# Patient Record
Sex: Female | Born: 2000 | Race: Black or African American | Hispanic: No | Marital: Single | Smoking: Never smoker
Health system: Southern US, Community
[De-identification: ages and names within clinical notes are randomized; demographics above are authoritative.]

## PROBLEM LIST (undated history)

## (undated) DIAGNOSIS — Z789 Other specified health status: Secondary | ICD-10-CM

## (undated) DIAGNOSIS — F32A Depression, unspecified: Secondary | ICD-10-CM

## (undated) HISTORY — DX: Depression, unspecified: F32.A

## (undated) HISTORY — DX: Other specified health status: Z78.9

## (undated) HISTORY — PX: NO PAST SURGERIES: SHX2092

---
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
## 2021-08-15 ENCOUNTER — Ambulatory Visit (LOCAL_COMMUNITY_HEALTH_CENTER): Payer: Medicaid Other

## 2021-08-15 VITALS — BP 122/75 | Ht 64.0 in | Wt 153.5 lb

## 2021-08-15 DIAGNOSIS — Z3201 Encounter for pregnancy test, result positive: Secondary | ICD-10-CM | POA: Diagnosis not present

## 2021-08-15 LAB — PREGNANCY, URINE: Preg Test, Ur: POSITIVE — AB

## 2021-08-15 MED ORDER — PRENATAL 27-0.8 MG PO TABS: 1.0000 | ORAL_TABLET | Freq: Every day | ORAL | 0 refills | Status: AC

## 2021-08-15 NOTE — Progress Notes (Signed)
UPT positive. Plans prenatal care at [facility]. Reports n/v daily. Advised to seek immediate med attn/go to ER if unable to eat/drink without vomiting. Discussed measures to help with n/v such as freq small meals, ginger ale, "bland" foods. Sent to clerk for preadmit. Josie Saunders, RN ? ?

## 2021-09-20 ENCOUNTER — Encounter: Payer: Self-pay | Admitting: Advanced Practice Midwife

## 2021-09-20 ENCOUNTER — Ambulatory Visit: Payer: Medicaid Other | Admitting: Advanced Practice Midwife

## 2021-09-20 DIAGNOSIS — F332 Major depressive disorder, recurrent severe without psychotic features: Secondary | ICD-10-CM

## 2021-09-20 DIAGNOSIS — Z7289 Other problems related to lifestyle: Secondary | ICD-10-CM | POA: Insufficient documentation

## 2021-09-20 DIAGNOSIS — Z3481 Encounter for supervision of other normal pregnancy, first trimester: Secondary | ICD-10-CM | POA: Diagnosis not present

## 2021-09-20 DIAGNOSIS — F17290 Nicotine dependence, other tobacco product, uncomplicated: Secondary | ICD-10-CM

## 2021-09-20 DIAGNOSIS — D573 Sickle-cell trait: Secondary | ICD-10-CM | POA: Insufficient documentation

## 2021-09-20 DIAGNOSIS — Z3480 Encounter for supervision of other normal pregnancy, unspecified trimester: Secondary | ICD-10-CM | POA: Insufficient documentation

## 2021-09-20 LAB — URINALYSIS
Bilirubin, UA: NEGATIVE
Glucose, UA: NEGATIVE
Ketones, UA: NEGATIVE
Leukocytes,UA: NEGATIVE
Nitrite, UA: NEGATIVE
Protein,UA: NEGATIVE
RBC, UA: NEGATIVE
Specific Gravity, UA: 1.02 (ref 1.005–1.030)
Urobilinogen, Ur: 0.2 mg/dL (ref 0.2–1.0)
pH, UA: 6.5 (ref 5.0–7.5)

## 2021-09-20 LAB — WET PREP FOR TRICH, YEAST, CLUE
Trichomonas Exam: NEGATIVE
Yeast Exam: NEGATIVE

## 2021-09-20 LAB — HEMOGLOBIN, FINGERSTICK: Hemoglobin: 13.4 g/dL (ref 11.1–15.9)

## 2021-09-20 NOTE — Progress Notes (Signed)
[facility] Health Department  Maternal Health Clinic   INITIAL PRENATAL VISIT NOTE  Subjective:  Michele Barnes is a 21 y.o. SBF exsmoker G2P0010 at [redacted]w[redacted]d being seen today to start prenatal care at the [facility] Department. She feels "I like it" about surprise pregnancy with no birth control. 21 yo employed FOB feels "he likes it" about pregnancy; in supportive 6 year relationship; he has no kids. She will begin a new job soon and is living alone. LMP 07/01/21. Denies u/s, or ER use this pregnancy. Denies cigs, vaping. Last cigar 06/2021. Last MJ 08/13/21, last ETOH 07/25/21 (3 shots Tequila). 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 PHQ-9=19. Cries 1x/wk, +anxiety, sleep up and down, appetite up and down, +moody, irritable, +anhedonia, low energy level, -SI/HI.  Last dental exam 2019.  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  Patient reports no complaints.  Contractions: Not present. Vag. Bleeding: None.  Movement: Absent. Denies leaking of fluid.   Indications for ASA therapy (per uptodate) One of the following: Previous pregnancy with preeclampsia, especially early onset and with an adverse outcome No Multifetal gestation No Chronic hypertension No Type 1 or 2 diabetes mellitus No Chronic kidney disease No Autoimmune disease (antiphospholipid syndrome, systemic lupus erythematosus) No  Two or more of the following: Nulliparity No Obesity (body mass index >30 kg/m2) No Family history of preeclampsia in mother or sister No Age ?35 years No Sociodemographic characteristics (African American race, low socioeconomic level) Yes Personal risk factors (eg, previous pregnancy with low birth  weight or small for gestational age infant, previous adverse pregnancy outcome [eg, stillbirth], interval >10 years between pregnancies) No   The following portions of the patient's history were reviewed and updated as appropriate: allergies, current medications, past family history, past medical history, past social history, past surgical history and problem list. Problem list updated.  Objective:   Vitals:   09/20/21 1316  BP: 124/78  Pulse: (!) 104  Temp: (!) 97.4 F (36.3 C)  Weight: 159 lb 12.8 oz (72.5 kg)    Fetal Status: Fetal Heart Rate (bpm): 160 Fundal Height: 10 cm Movement: Absent  Presentation: Undeterminable   Physical Exam Vitals and nursing note reviewed.  Constitutional:      General: She is not in acute distress.    Appearance: Normal appearance. She is well-developed and normal weight.  HENT:     Head: Normocephalic and atraumatic.     Right Ear: External ear normal.     Left Ear: External ear normal.     Nose: Nose normal. No congestion or rhinorrhea.     Mouth/Throat:     Lips: Pink.     Mouth: Mucous membranes are moist.     Dentition: Normal dentition. No dental caries.     Pharynx: Oropharynx is clear. Uvula midline.     Comments: Dentition: fair; last dental exam 2019 Eyes:     General: No scleral icterus.    Conjunctiva/sclera: Conjunctivae normal.  Neck:     Thyroid: No thyroid mass, thyromegaly or thyroid tenderness.  Cardiovascular:     Rate and Rhythm: Normal rate.     Pulses: Normal pulses.     Comments: Extremities are warm and well perfused Pulmonary:     Effort: Pulmonary effort is normal.  Breath sounds: Normal breath sounds.  Chest:     Chest wall: No mass.  Breasts:    Tanner Score is 5.     Breasts are symmetrical.     Right: Normal. No mass, nipple discharge or skin change.     Left: Normal. No mass, nipple discharge or skin change.  Abdominal:     General: Abdomen is flat.     Palpations: Abdomen is soft.      Tenderness: There is no abdominal tenderness.     Comments: Gravid, soft without masses or tenderness, good tone, FHR=160, FH 10-11 wks size  Genitourinary:    General: Normal vulva.     Exam position: Lithotomy position.     Pubic Area: No rash.      Labia:        Right: No rash.        Left: No rash.      Vagina: Vaginal discharge (grey leukorrhea, ph<4.5) present.     Cervix: Normal.     Uterus: Normal. Enlarged (Gravid 10-11 wks size). Not tender.      Adnexa: Right adnexa normal and left adnexa normal.     Rectum: Normal. No external hemorrhoid.  Musculoskeletal:     Right lower leg: No edema.     Left lower leg: No edema.  Lymphadenopathy:     Cervical: No cervical adenopathy.     Upper Body:     Right upper body: No axillary adenopathy.     Left upper body: No axillary adenopathy.  Skin:    General: Skin is warm.     Capillary Refill: Capillary refill takes less than 2 seconds.  Neurological:     Mental Status: She is alert.    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 Prenatal care, subsequent pregnancy, first trimester U/s ordered for dating Pt desires NIPS at time of u/s Counseled on weight gain of 15-25 lbs Please give pt dental list - Prenatal profile without Varicella/Rubella ([id number]) - HCV Ab w Reflex to Quant PCR - HIV-1/HIV-2 Qualitative RNA - Hemoglobinopathy evaluation -[id number] - Urine Culture - Chlamydia/GC NAA, Confirmation - Hgb A1c w/o eAG - [id number] Drug Screen - Urinalysis (Urine Dip) - WET PREP FOR TRICH, YEAST, CLUE - Hemoglobin, venipuncture  3. Severe episode of recurrent major depressive disorder, without psychotic features (HCC) Agrees to counseling with Kathreen Cosier, LCSW--please give pt contact info  4. Cigar smoker States last use 06/2021--counseled not to smoke    Discussed overview  of care and coordination with inpatient delivery practices including [facility], [facility], [facility] and [facility].   Reviewed Centering pregnancy as standard of care at [facility]   Preterm labor symptoms and general obstetric precautions including but not limited to vaginal bleeding, contractions, leaking of fluid and fetal movement were reviewed in detail with the patient.  Please refer to After Visit Summary for other counseling recommendations.   Return in about 4 weeks (around 10/18/2021) for routine PNC.  No future appointments.  Alberteen Spindle, CNM

## 2021-09-20 NOTE — Progress Notes (Signed)
Here today for 11.4 week MH IP. Taking PNV QD. Denies ED/hospital visits since +PT. Jacoya Bauman, RN ?

## 2021-09-20 NOTE — Progress Notes (Signed)
Hgb, UA and wet mount results reviewed while in clinic. Hal Morales, RN

## 2021-09-21 LAB — 789231 7+OXYCODONE-BUND
Amphetamines, Urine: NEGATIVE ng/mL
BENZODIAZ UR QL: NEGATIVE ng/mL
Barbiturate screen, urine: NEGATIVE ng/mL
Cannabinoid Quant, Ur: NEGATIVE ng/mL
Cocaine (Metab.): NEGATIVE ng/mL
OPIATE SCREEN URINE: NEGATIVE ng/mL
Oxycodone/Oxymorphone, Urine: NEGATIVE ng/mL
PCP Quant, Ur: NEGATIVE ng/mL

## 2021-09-22 LAB — URINE CULTURE: Organism ID, Bacteria: NO GROWTH

## 2021-09-23 LAB — HGB FRACTIONATION CASCADE
Hgb A2: 3.4 % — ABNORMAL HIGH (ref 1.8–3.2)
Hgb A: 55 % — ABNORMAL LOW (ref 96.4–98.8)
Hgb F: 0 % (ref 0.0–2.0)
Hgb S: 41.6 % — ABNORMAL HIGH

## 2021-09-23 LAB — CBC/D/PLT+RPR+RH+ABO+AB SCR
Antibody Screen: NEGATIVE
Basophils Absolute: 0.1 10*3/uL (ref 0.0–0.2)
Basos: 0 %
EOS (ABSOLUTE): 0.2 10*3/uL (ref 0.0–0.4)
Eos: 2 %
Hematocrit: 40.4 % (ref 34.0–46.6)
Hemoglobin: 13.8 g/dL (ref 11.1–15.9)
Hepatitis B Surface Ag: NEGATIVE
Immature Grans (Abs): 0 10*3/uL (ref 0.0–0.1)
Immature Granulocytes: 0 %
Lymphocytes Absolute: 1.5 10*3/uL (ref 0.7–3.1)
Lymphs: 13 %
MCH: 29.9 pg (ref 26.6–33.0)
MCHC: 34.2 g/dL (ref 31.5–35.7)
MCV: 88 fL (ref 79–97)
Monocytes Absolute: 0.6 10*3/uL (ref 0.1–0.9)
Monocytes: 5 %
Neutrophils Absolute: 8.8 10*3/uL — ABNORMAL HIGH (ref 1.4–7.0)
Neutrophils: 80 %
Platelets: 200 10*3/uL (ref 150–450)
RBC: 4.61 x10E6/uL (ref 3.77–5.28)
RDW: 12.6 % (ref 11.7–15.4)
RPR Ser Ql: NONREACTIVE
Rh Factor: POSITIVE
WBC: 11.1 10*3/uL — ABNORMAL HIGH (ref 3.4–10.8)

## 2021-09-23 LAB — HGB SOLUBILITY: Hgb Solubility: POSITIVE — AB

## 2021-09-23 LAB — HIV-1/HIV-2 QUALITATIVE RNA
HIV-1 RNA, Qualitative: NONREACTIVE
HIV-2 RNA, Qualitative: NONREACTIVE

## 2021-09-23 LAB — CHLAMYDIA/GC NAA, CONFIRMATION
Chlamydia trachomatis, NAA: NEGATIVE
Neisseria gonorrhoeae, NAA: NEGATIVE

## 2021-09-23 LAB — HGB A1C W/O EAG: Hgb A1c MFr Bld: 5 % (ref 4.8–5.6)

## 2021-09-23 LAB — HCV INTERPRETATION

## 2021-09-23 LAB — HCV AB W REFLEX TO QUANT PCR: HCV Ab: NONREACTIVE

## 2021-09-28 ENCOUNTER — Telehealth: Payer: Self-pay

## 2021-09-28 ENCOUNTER — Telehealth: Payer: Self-pay | Admitting: Family Medicine

## 2021-09-28 NOTE — Telephone Encounter (Signed)
Phone call to patient to provide phone number to call and schedule [facility] [place] appt due to ATC x3 by [facility] to schedule above but has not been able to reach patient. RN spoke with female identifying herself as "this is her mom." Same advised she will have patient return our call. Tawny Hopping, RN

## 2021-09-28 NOTE — Telephone Encounter (Signed)
See other phone note..Rozanna Cormany Brewer-Jensen, RN  °

## 2021-09-28 NOTE — Telephone Encounter (Signed)
Patient called back and call returned. Patient counseled that [facility] called her 3 times to schedule her U/S and they were unable to reach her. Patient given [facility] U/S scheduling number and counseled to call to set up her U/S. Patient read number back to me and states she will call [facility] to schedule her appointment. Marland KitchenBurt Knack, RN

## 2021-10-06 NOTE — Addendum Note (Signed)
Addended by: Heywood Bene on: 10/06/2021 11:25 AM   Modules accepted: Orders

## 2021-10-12 ENCOUNTER — Telehealth: Payer: Self-pay | Admitting: Family Medicine

## 2021-10-12 NOTE — Telephone Encounter (Signed)
Pt calls & requests her lab results from her New OB visit on 09/20/21.

## 2021-10-12 NOTE — Telephone Encounter (Signed)
Client requesting results of NIPS testing. Counseled this testing was not done at initial OB appt, but NIPS ordered with [place] appt (per provider note). Per pink sticky note, [facility] was unable to contact client via phone to schedule appt and [facility] gave client number to call to schedule. Per client, she called number and was told a new referral needed. Client given [facility] OB US scheduler number again and encouraged to call as referral for [place] stays open for several months. Client to call clinic back if unable to schedule appt. Rich Number, RN

## 2021-10-18 ENCOUNTER — Ambulatory Visit: Payer: Medicaid Other | Admitting: Advanced Practice Midwife

## 2021-10-18 VITALS — BP 105/69 | HR 76 | Temp 97.8°F | Wt 167.0 lb

## 2021-10-18 DIAGNOSIS — F17290 Nicotine dependence, other tobacco product, uncomplicated: Secondary | ICD-10-CM | POA: Diagnosis not present

## 2021-10-18 DIAGNOSIS — F332 Major depressive disorder, recurrent severe without psychotic features: Secondary | ICD-10-CM

## 2021-10-18 DIAGNOSIS — Z3481 Encounter for supervision of other normal pregnancy, first trimester: Secondary | ICD-10-CM

## 2021-10-18 DIAGNOSIS — Z3482 Encounter for supervision of other normal pregnancy, second trimester: Secondary | ICD-10-CM

## 2021-10-18 DIAGNOSIS — D573 Sickle-cell trait: Secondary | ICD-10-CM

## 2021-10-18 NOTE — Progress Notes (Addendum)
Patient here for MH RV at 15 4/7. Desires Materni 21 and AFP today. Patient states she tried to call [facility] to schedule her U/S and she was told they needed a new form. Patient unsure what they meant. TC to [facility] U/S scheduling at 4:20 and they are not open. Will try again tomorrow to find out what they need before scheduling patient's U/S. Patient phone number verified.Marland KitchenMarland KitchenBurt Knack, RN

## 2021-10-18 NOTE — Progress Notes (Signed)
Urine dip reviewed, no new orders. FOB made appointment to be tested for sickle cell trait.Burt Knack, RN

## 2021-10-18 NOTE — Progress Notes (Signed)
[facility]  PRENATAL VISIT NOTE  Subjective:  Michele Barnes is a 21 y.o. G2P0010 at [redacted]w[redacted]d being seen today for ongoing prenatal care.  She is currently monitored for the following issues for this low-risk pregnancy and has MDD (major depressive disorder), recurrent episode, severe (HCC); Self-mutilation, cutter, SI with involuntary committment [facility] 07/22/15; Prenatal care, subsequent pregnancy, first trimester; Cigar smoker; and Sickle cell trait (HCC) on their problem list.  Patient reports no complaints.  Contractions: Not present. Vag. Bleeding: None.  Movement: Absent. Denies leaking of fluid/ROM.   The following portions of the patient's history were reviewed and updated as appropriate: allergies, current medications, past family history, past medical history, past social history, past surgical history and problem list. Problem list updated.  Objective:   Vitals:   10/18/21 1540  BP: 105/69  Pulse: 76  Temp: 97.8 F (36.6 C)  Weight: 167 lb (75.8 kg)    Fetal Status: Fetal Heart Rate (bpm): 155 Fundal Height: 16 cm Movement: Absent     General:  Alert, oriented and cooperative. Patient is in no acute distress.  Skin: Skin is warm and dry. No rash noted.   Cardiovascular: Normal heart rate noted  Respiratory: Normal respiratory effort, no problems with respiration noted  Abdomen: Soft, gravid, appropriate for gestational age.  Pain/Pressure: Absent     Pelvic: Cervical exam deferred        Extremities: Normal range of motion.  Edema: None  Mental Status: Normal mood and affect. Normal behavior. Normal judgment and thought content.   Assessment and Plan:  Pregnancy: G2P0010 at [redacted]w[redacted]d  1. Prenatal care, subsequent pregnancy, first trimester 4 lb (1.814 kg) Gained 8 lbs in last 4 wks Not working Living with m. Aunt Last ETOH 07/25/21 (3 shots Tequila) Last MJ 3 wks ago and agrees to UDS Last cigar 3 wks ago Here with  FOB Desires NIPS and AFP only today [facility] tried to call pt x3 for first dating u/s and pt did not answer--RN to call tomorrow and schedule - MaterniT21 PLUS Core - AFP, Serum, Open Spina Bifida - Urinalysis (Urine Dip) - 627035 Drug Screen  2. Severe episode of recurrent major depressive disorder, without psychotic features [facility]) Did not make apt with Kathreen Cosier, LCSW as feels she is better now  3. Cigar smoker States last use 3 wks ago  4. Sickle cell trait (HCC) FOB present and made apt for lab draw for SS testing   Preterm labor symptoms and general obstetric precautions including but not limited to vaginal bleeding, contractions, leaking of fluid and fetal movement were reviewed in detail with the patient. Please refer to After Visit Summary for other counseling recommendations.  No follow-ups on file.  No future appointments.  Alberteen Spindle, CNM

## 2021-10-19 LAB — URINALYSIS
Bilirubin, UA: NEGATIVE
Glucose, UA: NEGATIVE
Ketones, UA: NEGATIVE
Leukocytes,UA: NEGATIVE
Nitrite, UA: NEGATIVE
Protein,UA: NEGATIVE
RBC, UA: NEGATIVE
Specific Gravity, UA: 1.02 (ref 1.005–1.030)
Urobilinogen, Ur: 0.2 mg/dL (ref 0.2–1.0)
pH, UA: 7 (ref 5.0–7.5)

## 2021-10-20 NOTE — Progress Notes (Signed)
[facility] U/S referral faxed with confirmation. [facility] needed a new referral due to patient now in 2nd trimester. Patient snapshot with correct phone number faxed as well. Patient aware to expect phone call from [facility] to schedule appointment.Burt Knack, RN

## 2021-10-23 LAB — AFP, SERUM, OPEN SPINA BIFIDA
AFP MoM: 0.88
AFP Value: 28.9 ng/mL
Gest. Age on Collection Date: 15.4 weeks
Maternal Age At EDD: 21 yr
OSBR Risk 1 IN: 10000
Test Results:: NEGATIVE
Weight: 167 [lb_av]

## 2021-10-23 LAB — MATERNIT 21 PLUS CORE, BLOOD
Fetal Fraction: 7
Result (T21): NEGATIVE
Trisomy 13 (Patau syndrome): NEGATIVE
Trisomy 18 (Edwards syndrome): NEGATIVE
Trisomy 21 (Down syndrome): NEGATIVE

## 2021-10-24 ENCOUNTER — Encounter: Payer: Self-pay | Admitting: Advanced Practice Midwife

## 2021-10-24 DIAGNOSIS — R825 Elevated urine levels of drugs, medicaments and biological substances: Secondary | ICD-10-CM | POA: Insufficient documentation

## 2021-10-24 LAB — 789231 7+OXYCODONE-BUND
Amphetamines, Urine: NEGATIVE ng/mL
BENZODIAZ UR QL: NEGATIVE ng/mL
Barbiturate screen, urine: NEGATIVE ng/mL
Cocaine (Metab.): NEGATIVE ng/mL
OPIATE SCREEN URINE: NEGATIVE ng/mL
Oxycodone/Oxymorphone, Urine: NEGATIVE ng/mL
PCP Quant, Ur: NEGATIVE ng/mL

## 2021-10-24 LAB — SPECIMEN STATUS REPORT

## 2021-10-24 LAB — CANNABINOID CONFIRMATION, UR
CANNABINOIDS: POSITIVE — AB
Carboxy THC GC/MS Conf: 84 ng/mL

## 2021-11-15 ENCOUNTER — Ambulatory Visit: Payer: Medicaid Other | Admitting: Advanced Practice Midwife

## 2021-11-15 VITALS — BP 108/65 | HR 72 | Temp 97.5°F | Wt 170.2 lb

## 2021-11-15 DIAGNOSIS — F332 Major depressive disorder, recurrent severe without psychotic features: Secondary | ICD-10-CM

## 2021-11-15 DIAGNOSIS — D573 Sickle-cell trait: Secondary | ICD-10-CM

## 2021-11-15 DIAGNOSIS — F17290 Nicotine dependence, other tobacco product, uncomplicated: Secondary | ICD-10-CM

## 2021-11-15 DIAGNOSIS — Z3481 Encounter for supervision of other normal pregnancy, first trimester: Secondary | ICD-10-CM

## 2021-11-15 DIAGNOSIS — Z3482 Encounter for supervision of other normal pregnancy, second trimester: Secondary | ICD-10-CM

## 2021-11-15 DIAGNOSIS — R825 Elevated urine levels of drugs, medicaments and biological substances: Secondary | ICD-10-CM

## 2021-11-15 NOTE — Progress Notes (Signed)
[facility]  PRENATAL VISIT NOTE  Subjective:  Michele Barnes is a 21 y.o. G2P0010 at [redacted]w[redacted]d being seen today for ongoing prenatal care.  She is currently monitored for the following issues for this low-risk pregnancy and has MDD (major depressive disorder), recurrent episode, severe (HCC); Self-mutilation, cutter, SI with involuntary committment [facility] 07/22/15; Prenatal care, subsequent pregnancy, first trimester; Cigar smoker; Sickle cell trait (HCC); and Positive urine drug screen 10/18/21 MJ on their problem list.  Patient reports  constipation and N&V q couple days .  Contractions: Not present. Vag. Bleeding: None.  Movement: Present. Denies leaking of fluid/ROM.   The following portions of the patient's history were reviewed and updated as appropriate: allergies, current medications, past family history, past medical history, past social history, past surgical history and problem list. Problem list updated.  Objective:   Vitals:   11/15/21 1611  BP: 108/65  Pulse: 72  Temp: (!) 97.5 F (36.4 C)  Weight: 170 lb 3.2 oz (77.2 kg)    Fetal Status: Fetal Heart Rate (bpm): 155 Fundal Height: 19 cm Movement: Present     General:  Alert, oriented and cooperative. Patient is in no acute distress.  Skin: Skin is warm and dry. No rash noted.   Cardiovascular: Normal heart rate noted  Respiratory: Normal respiratory effort, no problems with respiration noted  Abdomen: Soft, gravid, appropriate for gestational age.  Pain/Pressure: Absent     Pelvic: Cervical exam deferred        Extremities: Normal range of motion.  Edema: None  Mental Status: Normal mood and affect. Normal behavior. Normal judgment and thought content.   Assessment and Plan:  Pregnancy: G2P0010 at [redacted]w[redacted]d  1. Prenatal care, subsequent pregnancy, second trimester 7 lb 3.2 oz (3.266 kg) C/o constipation--suggestions given C/o N&V q couple days--suggestions given Moving to  [place] to live with FOB next week First u/s 11/21/21 NIPS 10/18/21=neg AFP only 10/18/21=neg Not working Here with FOB  - Urine Culture - [id number] Drug Screen - Urinalysis (Urine Dip)  2. Positive urine drug screen 10/18/21 MJ States last MJ 6 wks ago; agrees to UDS  3. Cigar smoker States last use 6 wks ago   5. Severe episode of recurrent major depressive disorder, without psychotic features (HCC) Denies sxs  6. Sickle cell trait (HCC) C&S q trimester and today FOB tested 11/09/21 and pending results   Preterm labor symptoms and general obstetric precautions including but not limited to vaginal bleeding, contractions, leaking of fluid and fetal movement were reviewed in detail with the patient. Please refer to After Visit Summary for other counseling recommendations.  Return in about 4 weeks (around 12/13/2021).  No future appointments.  Alberteen Spindle, CNM

## 2021-11-15 NOTE — Progress Notes (Addendum)
Aware of 11/21/2021 0800 [facility] [place] appt. FOB sickle cell tested 11/09/2021 and results pending. Per client, will be moving to [place], [place] in next week. Counseled on ways new prenatal care provider (when selected) can obtain her [facility] prenatal care records. Jossie Ng, RN Urine dip reviewed and Hazle Coca CNM aware of results. Jossie Ng, RN

## 2021-11-16 LAB — URINALYSIS
Bilirubin, UA: NEGATIVE
Glucose, UA: NEGATIVE
Ketones, UA: NEGATIVE
Leukocytes,UA: NEGATIVE
Nitrite, UA: NEGATIVE
Protein,UA: NEGATIVE
RBC, UA: NEGATIVE
Specific Gravity, UA: 1.02 (ref 1.005–1.030)
Urobilinogen, Ur: 0.2 mg/dL (ref 0.2–1.0)
pH, UA: 6 (ref 5.0–7.5)

## 2021-11-17 LAB — URINE CULTURE

## 2021-11-18 LAB — 789231 7+OXYCODONE-BUND
Amphetamines, Urine: NEGATIVE ng/mL
BENZODIAZ UR QL: NEGATIVE ng/mL
Barbiturate screen, urine: NEGATIVE ng/mL
Cocaine (Metab.): NEGATIVE ng/mL
OPIATE SCREEN URINE: NEGATIVE ng/mL
Oxycodone/Oxymorphone, Urine: NEGATIVE ng/mL
PCP Quant, Ur: NEGATIVE ng/mL

## 2021-11-18 LAB — CANNABINOID CONFIRMATION, UR
CANNABINOIDS: POSITIVE — AB
Carboxy THC GC/MS Conf: 90 ng/mL

## 2021-11-25 ENCOUNTER — Encounter: Payer: Self-pay | Admitting: Family Medicine

## 2021-11-25 NOTE — Progress Notes (Signed)
Chart will be addended for chart audit purposes. Jossie Ng, RN

## 2021-12-13 ENCOUNTER — Ambulatory Visit: Payer: Medicaid Other

## 2021-12-30 ENCOUNTER — Telehealth: Payer: Self-pay | Admitting: Family Medicine

## 2022-01-19 ENCOUNTER — Encounter: Payer: Self-pay | Admitting: Physician Assistant

## 2022-01-19 ENCOUNTER — Ambulatory Visit: Payer: Medicaid Other | Admitting: Physician Assistant

## 2022-01-19 VITALS — BP 114/74 | HR 111 | Temp 97.9°F | Wt 179.4 lb

## 2022-01-19 DIAGNOSIS — Z3483 Encounter for supervision of other normal pregnancy, third trimester: Secondary | ICD-10-CM

## 2022-01-19 DIAGNOSIS — Z3480 Encounter for supervision of other normal pregnancy, unspecified trimester: Secondary | ICD-10-CM

## 2022-01-19 DIAGNOSIS — R825 Elevated urine levels of drugs, medicaments and biological substances: Secondary | ICD-10-CM

## 2022-01-19 DIAGNOSIS — D573 Sickle-cell trait: Secondary | ICD-10-CM

## 2022-01-19 LAB — HEMOGLOBIN, FINGERSTICK: Hemoglobin: 13.1 g/dL (ref 11.1–15.9)

## 2022-01-19 NOTE — Progress Notes (Signed)
   PRENATAL VISIT NOTE  Subjective:  Michele Barnes is a 21 y.o. G2P0010 at [redacted]w[redacted]d being seen today for ongoing prenatal care.  She is currently monitored for the following issues for this high-risk pregnancy and has MDD (major depressive disorder), recurrent episode, severe ([place]); Self-mutilation, cutter, SI with involuntary committment [facility] 07/22/15; Supervision of other normal pregnancy, antepartum; Cigar smoker; Sickle cell trait ([place]); and Positive urine drug screen 10/18/21 MJ; +UDS MJ 11/15/21 on their problem list.  Patient reports  occ sharp pain under L breast if lifting L arm over head. No pain at present .  Contractions: Not present. Vag. Bleeding: None.  Movement: Present. Has not tried anything for pain. Denies leaking of fluid/ROM.   The following portions of the patient's history were reviewed and updated as appropriate: allergies, current medications, past family history, past medical history, past social history, past surgical history and problem list. Problem list updated.  Objective:   Vitals:   01/19/22 1322  BP: 114/74  Pulse: (!) 111  Temp: 97.9 F (36.6 C)  Weight: 179 lb 6.4 oz (81.4 kg)    Fetal Status: Fetal Heart Rate (bpm): 140 Fundal Height: 27 cm Movement: Present     General:  Alert, oriented and cooperative. Patient is in no acute distress.  Skin: Skin is warm and dry. No rash noted.   Cardiovascular: Normal heart rate noted  Respiratory: Normal respiratory effort, no problems with respiration noted  Abdomen: Soft, gravid, appropriate for gestational age.  Pain/Pressure: Absent     Pelvic: Cervical exam deferred        Extremities: Normal range of motion.  Edema: None  Mental Status: Normal mood and affect. Normal behavior. Normal judgment and thought content.   Assessment and Plan:  Pregnancy: G2P0010 at [redacted]w[redacted]d  1. Prenatal care, subsequent pregnancy, third trimester Suspect RUQ pain due to upper abdominal wall stretching with advancing pregnancy, rec  watchful waiting and alert [place] if sx worsen or assoc with N/V/F prior to next visit. Routine labs today. - Glucose, 1 hour - RPR - HIV-1/HIV-2 Qualitative RNA - Hemoglobin, venipuncture  2. Sickle-cell trait ([place]) FOB tested and has normal adult hemoglobin. Pt to schedule visit with sickle cell counselor. Third trim screening urine culture today. - Urine Culture  3. Positive urine drug screen 10/18/21 MJ; +UDS MJ 11/15/21 States has discontinued MJ use, enc ongoing abstinence. Repeat UDS today. "Plan of Safe Care" info given today. - [id number] 7+Oxycodone-Bund  4. MDD States mood is good, declines LCSW referral.  Preterm labor symptoms and general obstetric precautions including but not limited to vaginal bleeding, contractions, leaking of fluid and fetal movement were reviewed in detail with the patient. Patient visit in collaboration with [facility] resident Dr. Arnette Schaumann. Please refer to After Visit Summary for other counseling recommendations.  Return in about 2 weeks (around 02/02/2022) for Routine prenatal care; also needs to schedule visit with sickle cell counselor.  No future appointments.  Lora Havens, PA-C

## 2022-01-19 NOTE — Progress Notes (Signed)
Kept 12/22/2021 [facility] [place] appt. Counseled on need for sickle cell counselor appt (client with trait, FOB negative). Urine culture today with 28 week labs also. Counseled to take PNV QD as currently not taking. States has prescription at [facility] and encourged to pick up PNV today. Counseled on flu vaccine and Tdap recommendations in pregnancy and desires at next appt. Counseled on Tdap / flu vaccine recommendations for those who will spend any amt of time around infant. Rich Number, RN Hgb = 13.1 and no intervention needed per standing order. Rich Number, RN

## 2022-01-20 LAB — RPR: RPR Ser Ql: NONREACTIVE

## 2022-01-20 LAB — GLUCOSE, 1 HOUR GESTATIONAL: Gestational Diabetes Screen: 93 mg/dL (ref 70–139)

## 2022-01-20 LAB — HIV-1/HIV-2 QUALITATIVE RNA
HIV-1 RNA, Qualitative: NONREACTIVE
HIV-2 RNA, Qualitative: NONREACTIVE

## 2022-01-21 LAB — 789231 7+OXYCODONE-BUND
Amphetamines, Urine: NEGATIVE ng/mL
BENZODIAZ UR QL: NEGATIVE ng/mL
Barbiturate screen, urine: NEGATIVE ng/mL
Cannabinoid Quant, Ur: NEGATIVE ng/mL
Cocaine (Metab.): NEGATIVE ng/mL
OPIATE SCREEN URINE: NEGATIVE ng/mL
Oxycodone/Oxymorphone, Urine: NEGATIVE ng/mL
PCP Quant, Ur: NEGATIVE ng/mL

## 2022-01-21 LAB — URINE CULTURE

## 2022-02-01 NOTE — Addendum Note (Signed)
Addended by: Cletis Media on: 02/01/2022 12:42 PM   Modules accepted: Orders

## 2022-02-06 ENCOUNTER — Ambulatory Visit: Payer: Medicaid Other | Admitting: Advanced Practice Midwife

## 2022-02-06 VITALS — BP 125/80 | HR 107 | Temp 97.5°F | Wt 185.6 lb

## 2022-02-06 DIAGNOSIS — Z23 Encounter for immunization: Secondary | ICD-10-CM | POA: Diagnosis not present

## 2022-02-06 DIAGNOSIS — F332 Major depressive disorder, recurrent severe without psychotic features: Secondary | ICD-10-CM

## 2022-02-06 DIAGNOSIS — Z3480 Encounter for supervision of other normal pregnancy, unspecified trimester: Secondary | ICD-10-CM

## 2022-02-06 DIAGNOSIS — D573 Sickle-cell trait: Secondary | ICD-10-CM

## 2022-02-06 DIAGNOSIS — F17290 Nicotine dependence, other tobacco product, uncomplicated: Secondary | ICD-10-CM

## 2022-02-06 DIAGNOSIS — Z3483 Encounter for supervision of other normal pregnancy, third trimester: Secondary | ICD-10-CM

## 2022-02-06 DIAGNOSIS — R825 Elevated urine levels of drugs, medicaments and biological substances: Secondary | ICD-10-CM

## 2022-02-06 LAB — URINALYSIS
Bilirubin, UA: NEGATIVE
Glucose, UA: NEGATIVE
Ketones, UA: NEGATIVE
Nitrite, UA: NEGATIVE
Protein,UA: NEGATIVE
RBC, UA: NEGATIVE
Specific Gravity, UA: 1.02 (ref 1.005–1.030)
Urobilinogen, Ur: 0.2 mg/dL (ref 0.2–1.0)
pH, UA: 6 (ref 5.0–7.5)

## 2022-02-06 NOTE — Progress Notes (Signed)
[place] [facility]  PRENATAL VISIT NOTE  Subjective:  Michele Barnes is a 21 y.o. G2P0010 at [redacted]w[redacted]d being seen today for ongoing prenatal care.  She is currently monitored for the following issues for this low-risk pregnancy and has MDD (major depressive disorder), recurrent episode, severe ([place]); Self-mutilation, cutter, SI with involuntary committment [facility] 07/22/15; Supervision of other normal pregnancy, antepartum; Cigar smoker; Sickle cell trait ([place]); and Positive urine drug screen 10/18/21 MJ; +UDS MJ 11/15/21 on their problem list.  Patient reports no complaints.  Contractions: Not present. Vag. Bleeding: None.  Movement: Present. Denies leaking of fluid/ROM.   The following portions of the patient's history were reviewed and updated as appropriate: allergies, current medications, past family history, past medical history, past social history, past surgical history and problem list. Problem list updated.  Objective:   Vitals:   02/06/22 1434  BP: 125/80  Pulse: (!) 107  Temp: (!) 97.5 F (36.4 C)  Weight: 185 lb 9.6 oz (84.2 kg)    Fetal Status: Fetal Heart Rate (bpm): 140 Fundal Height: 33 cm Movement: Present     General:  Alert, oriented and cooperative. Patient is in no acute distress.  Skin: Skin is warm and dry. No rash noted.   Cardiovascular: Normal heart rate noted  Respiratory: Normal respiratory effort, no problems with respiration noted  Abdomen: Soft, gravid, appropriate for gestational age.  Pain/Pressure: Absent     Pelvic: Cervical exam deferred        Extremities: Normal range of motion.  Edema: None  Mental Status: Normal mood and affect. Normal behavior. Normal judgment and thought content.   Assessment and Plan:  Pregnancy: G2P0010 at 107w3d  1. Supervision of other normal pregnancy, antepartum C/o menstrual cramps--C&S , dip done 125/80 today; denies h/a Here with FOB Baby shower 03/11/22 6 lb wt gain inpast 3  wks Walking 2x/wk x 30 min 22 lb 9.6 oz (10.3 kg) Not working Has crib but no car seat.  - Urinalysis (Urine Dip) - [id number] Drug Screen - Urine Culture & Sensitivity  2. Sickle cell trait ([place]) Refusing SS counseling apt  3. Cigar smoker Denies use  4. Positive urine drug screen 10/18/21 MJ; +UDS MJ 11/15/21 Denies use Agrees to UDS today  5. Severe episode of recurrent major depressive disorder, without psychotic features ([place]) Declines counseling with Milton Ferguson, LCSW   Preterm labor symptoms and general obstetric precautions including but not limited to vaginal bleeding, contractions, leaking of fluid and fetal movement were reviewed in detail with the patient. Please refer to After Visit Summary for other counseling recommendations.  Return in about 2 weeks (around 02/20/2022) for routine PNC.  No future appointments.  Herbie Saxon, CNM

## 2022-02-06 NOTE — Progress Notes (Signed)
Patient here for MH RV at 31 3/7. Kick counts reviewed today and cards given. Patient states she has [facility] contact card. Looking for pediatrician in [place]. Declines sickle cell counselor appointment. Tdap and Flu vaccines given today, VIS given for each and NCIR given to patient. Patient has baby shower on 03/11/22.Marland KitchenJenetta Downer, RN

## 2022-02-08 LAB — 789231 7+OXYCODONE-BUND
Amphetamines, Urine: NEGATIVE ng/mL
BENZODIAZ UR QL: NEGATIVE ng/mL
Barbiturate screen, urine: NEGATIVE ng/mL
Cannabinoid Quant, Ur: NEGATIVE ng/mL
Cocaine (Metab.): NEGATIVE ng/mL
OPIATE SCREEN URINE: NEGATIVE ng/mL
Oxycodone/Oxymorphone, Urine: NEGATIVE ng/mL
PCP Quant, Ur: NEGATIVE ng/mL

## 2022-02-08 LAB — URINE CULTURE

## 2022-02-20 ENCOUNTER — Ambulatory Visit: Payer: Medicaid Other

## 2022-02-20 ENCOUNTER — Telehealth: Payer: Self-pay

## 2022-02-20 NOTE — Telephone Encounter (Signed)
[facility] for [place] RV 02/20/2022. Call to client and appt rescheduled for 02/23/2022 with arrival time of 1045. Rich Number, RN

## 2022-02-23 ENCOUNTER — Ambulatory Visit: Payer: Medicaid Other | Admitting: Family Medicine

## 2022-02-23 VITALS — BP 122/84 | HR 114 | Temp 97.4°F | Wt 191.0 lb

## 2022-02-23 DIAGNOSIS — R825 Elevated urine levels of drugs, medicaments and biological substances: Secondary | ICD-10-CM

## 2022-02-23 DIAGNOSIS — Z3483 Encounter for supervision of other normal pregnancy, third trimester: Secondary | ICD-10-CM

## 2022-02-23 DIAGNOSIS — F332 Major depressive disorder, recurrent severe without psychotic features: Secondary | ICD-10-CM

## 2022-02-23 DIAGNOSIS — O4693 Antepartum hemorrhage, unspecified, third trimester: Secondary | ICD-10-CM

## 2022-02-23 LAB — WET PREP FOR TRICH, YEAST, CLUE
Trichomonas Exam: NEGATIVE
Yeast Exam: NEGATIVE

## 2022-02-23 NOTE — Progress Notes (Signed)
[place] Department [facility] Clinic  PRENATAL VISIT NOTE  Subjective:  Michele Barnes is a 21 y.o. G2P0010 at [redacted]w[redacted]d being seen today for ongoing prenatal care.  She is currently monitored for the following issues for this low-risk pregnancy and has MDD (major depressive disorder), recurrent episode, severe ([place]); Self-mutilation, cutter, SI with involuntary committment [facility] 07/22/15; Supervision of other normal pregnancy, antepartum; Cigar smoker; Sickle cell trait ([place]); and Positive urine drug screen 10/18/21 MJ; +UDS MJ 11/15/21 on their problem list.  Patient reports bleeding- Reports that she has had daily spotting for the past 2 weeks. No clots or cramping. Reports not worse with sex or activity.  She is sexually active. She reports normal fetal movement throughout this time. Reports [place] [address] but no regular contractions the frequency of braxton hicks have not changed this past 2 weeks. Denies known exposures but partner was in the room.    Contractions: Irritability. Vag. Bleeding: Small.  Movement: Present. Denies leaking of fluid/ROM.   The following portions of the patient's history were reviewed and updated as appropriate: allergies, current medications, past family history, past medical history, past social history, past surgical history and problem list. Problem list updated.  Objective:   Vitals:   02/23/22 1122  BP: 122/84  Pulse: (!) 114  Temp: (!) 97.4 F (36.3 C)  Weight: 191 lb (86.6 kg)    Fetal Status: Fetal Heart Rate (bpm): 130 Fundal Height: 34 cm Movement: Present     General:  Alert, oriented and cooperative. Patient is in no acute distress.  Skin: Skin is warm and dry. No rash noted.   Cardiovascular: Normal heart rate noted  Respiratory: Normal respiratory effort, no problems with respiration noted  Abdomen: Soft, gravid, appropriate for gestational age.  Pain/Pressure: Absent     Pelvic: Cervical exam performed Dilation: Closed  Effacement (%): Thick Station: -3  Extremities: Normal range of motion.  Edema: None  Mental Status: Normal mood and affect. Normal behavior. Normal judgment and thought content.   Assessment and Plan:  Pregnancy: G2P0010 at [redacted]w[redacted]d  1. Encounter for supervision of other normal pregnancy in third trimester Up to date - GC/Chlamydia Probe Amp([facility])  2. Vaginal bleeding in pregnancy, third trimester Concerning and ddx includes infection-- concern for CT given friability. Less likely placental in origin given lack of increased ctx or abdominal pain. Does not have a previa-- reviewed [place] from August which showed a posterior placenta. Could be normal variant. Concern for possible abruption is present but given not increase pain it is harder to sx at this time.  - Discussed in detail the reasons to go to the hospital  - Ordered US to assess placenta and r/o abruption or other pathology - WET PREP FOR TRICH, YEAST, CLUE - GC/Chlamydia Probe Amp([facility])  3. Positive urine drug screen 10/18/21 MJ; +UDS MJ 11/15/21 Using MJ  4. Severe episode of recurrent major depressive disorder, without psychotic features ([place]) Stable   Preterm labor symptoms and general obstetric precautions including but not limited to vaginal bleeding, contractions, leaking of fluid and fetal movement were reviewed in detail with the patient. Please refer to After Visit Summary for other counseling recommendations.  Return in about 2 weeks (around 03/09/2022) for Routine prenatal care.  Future Appointments  Date Time Provider [place]  03/09/2022  2:00 PM [facility] PROVIDER [facility] None    Caren Macadam, MD

## 2022-02-23 NOTE — Progress Notes (Signed)
Unsure if still has [facility] contact card. Client and partner given a contact card today. Rich Number, RN Wet prep negative and Dr. Ernestina Patches aware. 2 week [facility] RV appt scheduled for 03/09/2022 with arrival time of 1345. Client declined appt reminder stating has appt in My Chart. Rich Number, RN

## 2022-03-01 LAB — GC/CHLAMYDIA PROBE AMP

## 2022-03-02 NOTE — Addendum Note (Signed)
Addended by: Jaysie Benthall on: 03/02/2022 11:45 AM   Modules accepted: Orders  

## 2022-03-03 ENCOUNTER — Telehealth: Payer: Self-pay

## 2022-03-03 ENCOUNTER — Other Ambulatory Visit: Payer: Medicaid Other

## 2022-03-03 NOTE — Telephone Encounter (Signed)
TC to patient who missed her NST this afternoon. Per [facility] MD, patient was to have NST today and 2 times next week. LM for patient to call for NST appointment first thing on Monday morning. Patient will need NST on Monday and can have next NST on Thursday during her scheduled [employer] RV appointment. LM with number to call.Jenetta Downer, RN

## 2022-03-03 NOTE — Telephone Encounter (Signed)
[facility] MD called and asked that [facility] schedule 2 NSTs for patient, one preferably today, up until patient [redacted] weeks GA. Called patient and scheduled [place] for today. Procedure explained to patient. BThiele RN

## 2022-03-06 NOTE — Telephone Encounter (Signed)
TC to patient to schedule NST today. Patient missed her NST appointment on Friday. Patient was counseled about importance of Friday NST and two NST's this week. Patient has [place] RV on 03/09/22 and can have NST during that appointment and also needs one today. LM with number to call. TC to patient emergency contact, unable to LM. Marland KitchenJenetta Downer, RN

## 2022-03-09 ENCOUNTER — Ambulatory Visit: Payer: Medicaid Other

## 2022-03-09 NOTE — Telephone Encounter (Signed)
Patient called due to her cancelling appointment 03/09/22 for MH RV and NST.   NO answer and LVM for patient to return call at [phone] to schedule MH RV as soon as possible.   Patient has [facility] IOL scheduled tomorrow 03/10/22 at 11am.   Earlyne Iba, RN

## 2022-03-09 NOTE — Telephone Encounter (Signed)
No response from client to phone message left earlier today. Phone encounter closed as client has [facility] IOL tomorrow at 1100 ([place]). Jossie Ng, RN

## 2022-03-11 DIAGNOSIS — Z3483 Encounter for supervision of other normal pregnancy, third trimester: Secondary | ICD-10-CM | POA: Diagnosis not present

## 2022-03-27 ENCOUNTER — Ambulatory Visit: Payer: Medicaid Other | Admitting: Advanced Practice Midwife

## 2022-03-27 ENCOUNTER — Encounter: Payer: Self-pay | Admitting: Advanced Practice Midwife

## 2022-03-27 NOTE — Progress Notes (Signed)
Patient here for 2 week PP follow-up. SVD 03/11/22. Marland KitchenBurt Knack, RN

## 2022-03-27 NOTE — Progress Notes (Signed)
S: 21 yo SBF G2P1 here for 2 wk pp check. IOL for chronic abruption at 64 1/7 with SVD on 03/11/22 F 6#9 with epidural. Bottlefeeding q 2 hours (2-3 oz) and at peds apt was 6#14. FOB and both mom's helping with baby. Brown-pink lochia. Pt states she is "fine" and not experiencing baby blues or depression. Here with FOB  O: 126/75  A/P: 2 wk pp doing well with support. RTC 4 wks for pp exam and Nexplanon insertion. Counseled no sex until then

## 2022-04-05 ENCOUNTER — Telehealth: Payer: Self-pay

## 2022-04-05 NOTE — Telephone Encounter (Signed)
Call to client to schedule post-partum appt. Desires pm appt and scheduled for 05/02/2022 with arrival time of 1430. Client states desires Depo for birth control. Jossie Ng, RN

## 2022-05-02 ENCOUNTER — Ambulatory Visit: Payer: Medicaid Other

## 2022-05-02 NOTE — Progress Notes (Deleted)
[facility] Department  Postpartum Exam  Michele Barnes is a 22 y.o. G1P0110 female who presents for a postpartum visit. She is 7  w3d  postpartum following a SVD. 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  I have fully reviewed the prenatal and intrapartum course. The delivery was at [redacted]w[redacted]d gestational weeks.  Anesthesia: epidural. Postpartum course has been ***. Baby is doing well***. Baby is feeding by bottle - {formula:72}. Bleeding {vag bleed:12292}. Bowel function is {normal:32111}. Bladder function is {normal:32111}. Patient {is/is not:9024} sexually active. Contraception method is {contraceptive method:5051}. Postpartum depression screening: {gen negative/positive:315881}.   The pregnancy intention screening data noted above was reviewed. Potential methods of contraception were discussed. The patient elected to proceed with No data recorded.   Health Maintenance Due  Topic Date Due   COVID-19 Vaccine (1) Never done   HPV VACCINES (1 - 2-dose series) Never done   HIV Screening  Never done   PAP-Cervical Cytology Screening  03/10/2022   PAP SMEAR-Modifier  03/10/2022    {Common ambulatory SmartLinks:19316}  Review of Systems {ros; complete:30496}  Objective:  There were no vitals taken for this visit.   General:  {gen appearance:16600}   Breasts:  {desc; normal/abnormal/not indicated:14647}  Lungs: {lung exam:16931}  Heart:  {heart exam:5510}  Abdomen: {abdomen exam:16834}   Wound {Wound assessment:11097}  GU exam:  {desc; normal/abnormal/not indicated:14647}       Assessment:    1. Postpartum exam ***   *** postpartum exam.   Plan:   Essential components of care per ACOG recommendations:  1.  Mood and well being: Patient with {gen negative/positive:315881} depression screening today. Reviewed local resources for support.  - Patient tobacco use? {tobacco use:25506}  - hx of drug use? {yes/no:25505}    2. Infant care and feeding:  -Patient  currently breastmilk feeding? {yes/no:25502}  -Social determinants of health (SDOH) reviewed in EPIC. No concerns***The following needs were identified***  3. Sexuality, contraception and birth spacing - Patient {DOES_DOES GMW:10272} want a pregnancy in the next year.  Desired family size is {NUMBER 1-10:22536} children.  - Reviewed reproductive life planning. Reviewed options based on patient desire and reproductive life plan. Patient is interested in {[employer] Methods:24109}. This {WAS/WAS NOT:[phone]::"was not"} provided to the patient today. *** if not why not clearly documented  Risks, benefits, and typical effectiveness rates were reviewed.  Questions were answered.  Written information was also given to the patient to review.    The patient will follow up in  {NUMBER 1-10:22536} {days/wks/mos/yrs:310907} for surveillance.  The patient was told to call with any further questions, or with any concerns about this method of contraception.  Emphasized use of condoms 100% of the time for STI prevention.  Patient was offered ECP based on {unprotected sex categories:26659}.  Patient is within {NUMBER 1-10:22536} days of unprotected sex. Patient was offered ECP. Reviewed options and patient desired {ECP options:27263}   - Discussed birth spacing of 18 months  4. Sleep and fatigue -Encouraged family/partner/community support of 4 hrs of uninterrupted sleep to help with mood and fatigue  5. Physical Recovery  - Discussed patients delivery and complications. She describes her labor as {description:25511} - Patient had a Vaginal, no problems at delivery. Patient had a  no  laceration. Perineal healing reviewed. Patient expressed understanding - Patient has urinary incontinence? {yes/no:25515} - Patient {ACTION; IS/IS ZDG:[id number]} safe to resume physical and sexual activity  6.  Health Maintenance - HM due items addressed {Yes or If no, why not?:20788} - Last  pap smear No results found  for: "DIAGPAP" Pap smear {done:10129} at today's visit.  -Breast Cancer screening indicated? {indicated:25516}  7. Chronic Disease/Pregnancy Condition follow up: {Follow up:25499}  - PCP follow up  Sharlet Salina, [place] for [place]

## 2022-11-17 ENCOUNTER — Ambulatory Visit: Payer: Medicaid Other

## 2022-12-27 ENCOUNTER — Ambulatory Visit (LOCAL_COMMUNITY_HEALTH_CENTER): Payer: Medicaid Other

## 2022-12-27 VITALS — BP 127/80 | Ht 64.0 in | Wt 169.0 lb

## 2022-12-27 DIAGNOSIS — Z309 Encounter for contraceptive management, unspecified: Secondary | ICD-10-CM | POA: Diagnosis not present

## 2022-12-27 DIAGNOSIS — Z3201 Encounter for pregnancy test, result positive: Secondary | ICD-10-CM

## 2022-12-27 LAB — PREGNANCY, URINE: Preg Test, Ur: POSITIVE — AB

## 2022-12-27 MED ORDER — PRENATAL 27-0.8 MG PO TABS: 1.0000 | ORAL_TABLET | Freq: Every day | ORAL | Status: AC

## 2022-12-27 NOTE — Progress Notes (Signed)
UPT positive. Plans care at [facility]; sent to clerk for preadmit.   The patient was dispensed prenatal vitamins #100 today. I provided counseling today regarding the medication. We discussed the medication, the side effects and when to call clinic.   Positive pregnancy packet reviewed and given to patient. Also counseled on hydration and when to seek medical attention.   Patient given the opportunity to ask questions. Questions answered and patient verbalizes understanding.   Abagail Kitchens, RN
